# Patient Record
Sex: Male | Born: 1970 | Race: White | Hispanic: No | Marital: Married | Smoking: Current every day smoker
Health system: Southern US, Community
[De-identification: ages and names within clinical notes are randomized; demographics above are authoritative.]

## PROBLEM LIST (undated history)

## (undated) DIAGNOSIS — K219 Gastro-esophageal reflux disease without esophagitis: Secondary | ICD-10-CM

---
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
## 2016-12-07 ENCOUNTER — Emergency Department (HOSPITAL_BASED_OUTPATIENT_CLINIC_OR_DEPARTMENT_OTHER)
Admission: EM | Admit: 2016-12-07 | Discharge: 2016-12-07 | Disposition: A | Discharge status: Home / Self Care | Payer: BLUE CROSS/BLUE SHIELD | Attending: Emergency Medicine | Admitting: Emergency Medicine

## 2016-12-07 ENCOUNTER — Encounter (HOSPITAL_BASED_OUTPATIENT_CLINIC_OR_DEPARTMENT_OTHER): Payer: Self-pay | Admitting: *Deleted

## 2016-12-07 ENCOUNTER — Emergency Department (HOSPITAL_BASED_OUTPATIENT_CLINIC_OR_DEPARTMENT_OTHER): Payer: BLUE CROSS/BLUE SHIELD

## 2016-12-07 DIAGNOSIS — Z791 Long term (current) use of non-steroidal anti-inflammatories (NSAID): Secondary | ICD-10-CM | POA: Diagnosis not present

## 2016-12-07 DIAGNOSIS — F17210 Nicotine dependence, cigarettes, uncomplicated: Secondary | ICD-10-CM | POA: Insufficient documentation

## 2016-12-07 DIAGNOSIS — R1031 Right lower quadrant pain: Secondary | ICD-10-CM | POA: Diagnosis present

## 2016-12-07 DIAGNOSIS — N200 Calculus of kidney: Secondary | ICD-10-CM

## 2016-12-07 HISTORY — DX: Gastro-esophageal reflux disease without esophagitis: K21.9

## 2016-12-07 LAB — CBC WITH DIFFERENTIAL/PLATELET
Basophils Absolute: 0 10*3/uL (ref 0.0–0.1)
Basophils Relative: 0 %
EOS PCT: 0 %
Eosinophils Absolute: 0 10*3/uL (ref 0.0–0.7)
HCT: 45.4 % (ref 39.0–52.0)
Hemoglobin: 15.5 g/dL (ref 13.0–17.0)
LYMPHS ABS: 1.4 10*3/uL (ref 0.7–4.0)
LYMPHS PCT: 12 %
MCH: 30.3 pg (ref 26.0–34.0)
MCHC: 34.1 g/dL (ref 30.0–36.0)
MCV: 88.8 fL (ref 78.0–100.0)
Monocytes Absolute: 0.3 10*3/uL (ref 0.1–1.0)
Monocytes Relative: 3 %
Neutro Abs: 10.2 10*3/uL — ABNORMAL HIGH (ref 1.7–7.7)
Neutrophils Relative %: 85 %
PLATELETS: 312 10*3/uL (ref 150–400)
RBC: 5.11 MIL/uL (ref 4.22–5.81)
RDW: 13.5 % (ref 11.5–15.5)
WBC: 12 10*3/uL — AB (ref 4.0–10.5)

## 2016-12-07 LAB — URINALYSIS, ROUTINE W REFLEX MICROSCOPIC
Bilirubin Urine: NEGATIVE
GLUCOSE, UA: NEGATIVE mg/dL
HGB URINE DIPSTICK: NEGATIVE
Ketones, ur: NEGATIVE mg/dL
Leukocytes, UA: NEGATIVE
Nitrite: NEGATIVE
Protein, ur: NEGATIVE mg/dL
SPECIFIC GRAVITY, URINE: 1.02 (ref 1.005–1.030)
pH: 7 (ref 5.0–8.0)

## 2016-12-07 LAB — COMPREHENSIVE METABOLIC PANEL
ALT: 32 U/L (ref 17–63)
ANION GAP: 5 (ref 5–15)
AST: 32 U/L (ref 15–41)
Albumin: 4.6 g/dL (ref 3.5–5.0)
Alkaline Phosphatase: 84 U/L (ref 38–126)
BUN: 17 mg/dL (ref 6–20)
CO2: 28 mmol/L (ref 22–32)
CREATININE: 1.25 mg/dL — AB (ref 0.61–1.24)
Calcium: 9.6 mg/dL (ref 8.9–10.3)
Chloride: 105 mmol/L (ref 101–111)
Glucose, Bld: 127 mg/dL — ABNORMAL HIGH (ref 65–99)
Potassium: 4.4 mmol/L (ref 3.5–5.1)
Sodium: 138 mmol/L (ref 135–145)
Total Bilirubin: 0.6 mg/dL (ref 0.3–1.2)
Total Protein: 7.7 g/dL (ref 6.5–8.1)

## 2016-12-07 LAB — LIPASE, BLOOD: LIPASE: 31 U/L (ref 11–51)

## 2016-12-07 MED ORDER — ONDANSETRON HCL 4 MG PO TABS: 4.0000 mg | ORAL_TABLET | Freq: Four times a day (QID) | ORAL | 0 refills | Status: AC

## 2016-12-07 MED ORDER — KETOROLAC TROMETHAMINE 30 MG/ML IJ SOLN
30.0000 mg | Freq: Once | INTRAMUSCULAR | Status: AC
  Administered 2016-12-07: 30 mg via INTRAVENOUS
  Filled 2016-12-07: qty 1

## 2016-12-07 MED ORDER — TAMSULOSIN HCL 0.4 MG PO CAPS: 0.4000 mg | ORAL_CAPSULE | Freq: Every day | ORAL | 0 refills | Status: AC

## 2016-12-07 MED ORDER — HYDROCODONE-ACETAMINOPHEN 5-325 MG PO TABS: 2.0000 | ORAL_TABLET | ORAL | 0 refills | Status: AC | PRN

## 2016-12-07 MED ORDER — HYDROMORPHONE HCL 1 MG/ML IJ SOLN
1.0000 mg | Freq: Once | INTRAMUSCULAR | Status: DC
Start: 2016-12-07 — End: 2016-12-07

## 2016-12-07 MED ORDER — SODIUM CHLORIDE 0.9 % IV BOLUS (SEPSIS)
1000.0000 mL | Freq: Once | INTRAVENOUS | Status: AC
  Administered 2016-12-07: 1000 mL via INTRAVENOUS

## 2016-12-07 MED ORDER — ONDANSETRON HCL 4 MG/2ML IJ SOLN
4.0000 mg | Freq: Once | INTRAMUSCULAR | Status: AC
  Administered 2016-12-07: 4 mg via INTRAVENOUS
  Filled 2016-12-07: qty 2

## 2016-12-07 NOTE — Discharge Instructions (Signed)
Return if any problems.  The cardiologist will call and set appointment for you to wear a monitor and to have a heart rate evaluation/  Schedule to see the Urologist for evaluation

## 2016-12-07 NOTE — ED Triage Notes (Signed)
Pt reports periumbilical and R lower back pain since last night. Denies fever. Reports n/v this am. Was seen at Urgent Care this morning and sent here (U/A from Urgent Care negative).

## 2016-12-07 NOTE — ED Notes (Signed)
ED Provider at bedside. 

## 2016-12-07 NOTE — ED Notes (Signed)
Doctor will call back on [phone]

## 2016-12-07 NOTE — ED Notes (Signed)
Patient transported to CT 

## 2016-12-07 NOTE — ED Provider Notes (Signed)
[facility]   CSN: [id number] Arrival date & time: 12/07/16  [id number]     History   Chief Complaint Chief Complaint  Patient presents with  . Abdominal Pain    HPI Bradley Hardy is a 46 y.o. male.  The history is provided by the patient. No language interpreter was used.  Abdominal Pain   This is a new problem. The current episode started yesterday. The problem occurs constantly. The problem has been gradually worsening. The pain is located in the RLQ. The pain is moderate. Nothing aggravates the symptoms. Nothing relieves the symptoms. Past workup does not include CT scan.  Pt feels like he has a kidney stone.  Pt reports he has the same pain as previous stone  Past Medical History:  Diagnosis Date  . GERD (gastroesophageal reflux disease)     There are no active problems to display for this patient.   Past Surgical History:  Procedure Laterality Date  . NOSE SURGERY  1991       Home Medications    Prior to Admission medications   Medication Sig Start Date End Date Taking? Authorizing Provider  ibuprofen (ADVIL,MOTRIN) 200 MG tablet Take 400 mg by mouth daily.   Yes Historical Provider, MD  HYDROcodone-acetaminophen (NORCO/VICODIN) 5-325 MG tablet Take 2 tablets by mouth every 4 (four) hours as needed. 12/07/16   Elson AreasLeslie K Devynne Sturdivant, PA-C  ondansetron (ZOFRAN) 4 MG tablet Take 1 tablet (4 mg total) by mouth every 6 (six) hours. 12/07/16   Elson AreasLeslie K Sophiamarie Nease, PA-C  tamsulosin (FLOMAX) 0.4 MG CAPS capsule Take 1 capsule (0.4 mg total) by mouth daily. 12/07/16   Elson AreasLeslie K Francis Doenges, PA-C    Family History No family history on file.  Social History Social History  Substance Use Topics  . Smoking status: Current Every Day Smoker    Types: E-cigarettes  . Smokeless tobacco: Never Used  . Alcohol use Yes     Comment: occ     Allergies   Patient has no known allergies.   Review of Systems Review of Systems  Gastrointestinal: Positive for abdominal  pain.  All other systems reviewed and are negative.    Physical Exam Updated Vital Signs BP 113/89   Pulse (!) 48   Temp 99 F (37.2 C) (Oral)   Resp 20   Ht 6\' 1"  (1.854 m)   Wt (!) 154.2 kg   SpO2 98%   BMI 44.86 kg/m   Physical Exam  Constitutional: He appears well-developed and well-nourished.  HENT:  Head: Normocephalic and atraumatic.  Right Ear: External ear normal.  Left Ear: External ear normal.  Eyes: Conjunctivae are normal.  Neck: Neck supple.  Cardiovascular: Regular rhythm.   No murmur heard. bradycardia  Pulmonary/Chest: Effort normal and breath sounds normal. No respiratory distress.  Abdominal: Soft. There is no tenderness.  Musculoskeletal: He exhibits no edema.  Neurological: He is alert.  Skin: Skin is warm and dry.  Psychiatric: He has a normal mood and affect.  Nursing note and vitals reviewed.    ED Treatments / Results  Labs (all labs ordered are listed, but only abnormal results are displayed) Labs Reviewed  CBC WITH DIFFERENTIAL/PLATELET - Abnormal; Notable for the following:       Result Value   WBC 12.0 (*)    Neutro Abs 10.2 (*)    All other components within normal limits  COMPREHENSIVE METABOLIC PANEL - Abnormal; Notable for the following:    Glucose, Bld 127 (*)  Creatinine, Ser 1.25 (*)    All other components within normal limits  URINALYSIS, ROUTINE W REFLEX MICROSCOPIC  LIPASE, BLOOD    EKG  EKG Interpretation  Date/Time:  Saturday December 07 2016 11:03:36 EST Ventricular Rate:  47 PR Interval:    QRS Duration: 132 QT Interval:  439 QTC Calculation: 389 R Axis:   28 Text Interpretation:  Sinus bradycardia IVCD, consider atypical RBBB ST elev, probable normal early repol pattern No old tracing to compare Confirmed by GOLDSTON MD, SCOTT [phone]) on 12/07/2016 11:06:59 AM       Radiology Ct Renal Stone Study  Result Date: 12/07/2016 CLINICAL DATA:  Right-sided flank pain EXAM: CT ABDOMEN AND PELVIS WITHOUT  CONTRAST TECHNIQUE: Multidetector CT imaging of the abdomen and pelvis was performed following the standard protocol without IV contrast. COMPARISON:  None. FINDINGS: Lower chest: No acute abnormality. Hepatobiliary: No focal liver abnormality is seen. No gallstones, gallbladder wall thickening, or biliary dilatation. Pancreas: Unremarkable. No pancreatic ductal dilatation or surrounding inflammatory changes. Spleen: Normal in size without focal abnormality. Adrenals/Urinary Tract: The adrenal glands demonstrate evidence of a 2.4 cm hypodense lesion within the left adrenal consistent with adenoma. The left kidney demonstrates some nonobstructive changes the left ureter is within normal limits. The bladder is decompressed. Right kidney demonstrates increased perinephric stranding as well as mild hydronephrosis. A 4 mm distal right ureteral stone is noted best seen on image number 74 of series 5. Stomach/Bowel: Scattered diverticular changes noted without evidence of diverticulitis. The appendix is within normal limits. No obstructive changes are seen. Vascular/Lymphatic: Aortic atherosclerosis. No enlarged abdominal or pelvic lymph nodes. Reproductive: Uterus and bilateral adnexa are unremarkable. Other: No abdominal wall hernia or abnormality. No abdominopelvic ascites. Musculoskeletal: No acute or significant osseous findings. IMPRESSION: Distal right ureteral stone with obstructive change. Nonobstructing left renal stones. Likely Left adrenal adenoma. Electronically Signed   By: Alcide Clever M.D.   On: 12/07/2016 10:47    Procedures Procedures (including critical care time)  Medications Ordered in ED Medications  ondansetron (ZOFRAN) injection 4 mg (4 mg Intravenous Given 12/07/16 1131)  ketorolac (TORADOL) 30 MG/ML injection 30 mg (30 mg Intravenous Given 12/07/16 1133)  sodium chloride 0.9 % bolus 1,000 mL (0 mLs Intravenous Stopped 12/07/16 1305)     Initial Impression / Assessment and Plan / ED  Course  I have reviewed the triage vital signs and the nursing notes.  Pertinent labs & imaging results that were available during my care of the patient were reviewed by me and considered in my medical decision making (see chart for details).     Ct shows stone.   Pt is bradycardic to 39.  Pt observed extended period of time.  No narcotics.   I spoke with Dr. Graciela Husbands who will schedule pt for a holter monitor and office follow up.  Pt is advised to see Urologist for complete evaluation.   Final Clinical Impressions(s) / ED Diagnoses   Final diagnoses:  Right kidney stone    New Prescriptions Discharge Medication List as of 12/07/2016 12:43 PM     Meds ordered this encounter  Medications  . ibuprofen (ADVIL,MOTRIN) 200 MG tablet    Sig: Take 400 mg by mouth daily.  Marland Kitchen DISCONTD: HYDROmorphone (DILAUDID) injection 1 mg  . ondansetron (ZOFRAN) injection 4 mg  . ketorolac (TORADOL) 30 MG/ML injection 30 mg  . sodium chloride 0.9 % bolus 1,000 mL  . tamsulosin (FLOMAX) 0.4 MG CAPS capsule    Sig: Take 1 capsule (  0.4 mg total) by mouth daily.    Dispense:  15 capsule    Refill:  0    Order Specific Question:   Supervising Provider    Answer:   MILLER, Rayland [[id number]]  . HYDROcodone-acetaminophen (NORCO/VICODIN) 5-325 MG tablet    Sig: Take 2 tablets by mouth every 4 (four) hours as needed.    Dispense:  20 tablet    Refill:  0    Order Specific Question:   Supervising Provider    Answer:   MILLER, Jackob [[id number]]  . ondansetron (ZOFRAN) 4 MG tablet    Sig: Take 1 tablet (4 mg total) by mouth every 6 (six) hours.    Dispense:  12 tablet    Refill:  0    Order Specific Question:   Supervising Provider    Answer:   Eber Hong [[id number]]   An After Visit Summary was printed and given to the patient.    Lonia Skinner Gore, PA-C 12/07/16 1633    Pricilla Loveless, MD 12/08/16 [phone]

## 2016-12-09 ENCOUNTER — Telehealth: Payer: Self-pay

## 2016-12-09 NOTE — Telephone Encounter (Signed)
Lmov for patient to call back and schedule a ED Fu  Was seen on 12/07/16  Will try again at a later time.

## 2016-12-10 ENCOUNTER — Other Ambulatory Visit: Payer: Self-pay | Admitting: *Deleted

## 2016-12-10 DIAGNOSIS — R001 Bradycardia, unspecified: Secondary | ICD-10-CM

## 2016-12-10 NOTE — Telephone Encounter (Signed)
[place] see Dr Graciela HusbandsKlein on 12/27/16  Nothing further needed

## 2016-12-27 ENCOUNTER — Institutional Professional Consult (permissible substitution): Payer: BLUE CROSS/BLUE SHIELD | Admitting: Internal Medicine

## 2017-01-01 ENCOUNTER — Ambulatory Visit (INDEPENDENT_AMBULATORY_CARE_PROVIDER_SITE_OTHER): Payer: BLUE CROSS/BLUE SHIELD

## 2017-01-01 DIAGNOSIS — R001 Bradycardia, unspecified: Secondary | ICD-10-CM

## 2017-01-07 ENCOUNTER — Encounter (INDEPENDENT_AMBULATORY_CARE_PROVIDER_SITE_OTHER): Payer: Self-pay

## 2017-01-07 ENCOUNTER — Ambulatory Visit (INDEPENDENT_AMBULATORY_CARE_PROVIDER_SITE_OTHER): Payer: BLUE CROSS/BLUE SHIELD | Admitting: Internal Medicine

## 2017-01-07 VITALS — BP 123/77 | HR 76 | Ht 73.0 in | Wt 338.0 lb

## 2017-01-07 DIAGNOSIS — R001 Bradycardia, unspecified: Secondary | ICD-10-CM

## 2017-01-07 NOTE — Progress Notes (Signed)
ELECTROPHYSIOLOGY CONSULT NOTE  Patient ID: Bradley Hardy, MRN: [id number], DOB/AGE: [birth year] 46 y.o. Admit date: (Not on file) Date of Consult: 01/07/2017  Primary Physician: No PCP Per Patient Primary Cardiologist:  new   Bradley Hardy is being seen today for the evaluation of bradycardia at the request of ER MD     HPI Bradley Hardy is a 46 y.o. male presented last month with a kidney stone for severe pain. He was noted to be bradycardic into the 30s and hypotensive. He was treated with volume with gradual improvement.  He underwent Holter monitoring which demonstrated near-normal Heart rate excursion.  He has a history of atypical chest pain. He has been diagnosed with reflux. These episodes are unassociated with exertion. More recently he has had some chest discomfort which is aggravated by turning of his chest. He does have a family history of coronary disease.  He has been working on losing weight. He is down to 350 pounds. He says it has been years and years since he has been in this light.  He has sleep disordered breathing.      Past Medical History:  Diagnosis Date  . GERD (gastroesophageal reflux disease)       Surgical History:  Past Surgical History:  Procedure Laterality Date  . NOSE SURGERY  1991     Home Meds: Prior to Admission medications   Medication Sig Start Date End Date Taking? Authorizing Provider  HYDROcodone-acetaminophen (NORCO/VICODIN) 5-325 MG tablet Take 2 tablets by mouth every 4 (four) hours as needed. 12/07/16   Elson Areas, PA-C  ibuprofen (ADVIL,MOTRIN) 200 MG tablet Take 400 mg by mouth daily.    Historical Provider, MD  ondansetron (ZOFRAN) 4 MG tablet Take 1 tablet (4 mg total) by mouth every 6 (six) hours. 12/07/16   Elson Areas, PA-C  tamsulosin (FLOMAX) 0.4 MG CAPS capsule Take 1 capsule (0.4 mg total) by mouth daily. 12/07/16   Elson Areas, PA-C    Allergies: No Known Allergies  Social History   Social  History  . Marital status: Married    Spouse name: N/A  . Number of children: N/A  . Years of education: N/A   Occupational History  . Not on file.   Social History Main Topics  . Smoking status: Current Every Day Smoker    Types: E-cigarettes  . Smokeless tobacco: Never Used  . Alcohol use Yes     Comment: occ  . Drug use: No  . Sexual activity: Not on file   Other Topics Concern  . Not on file   Social History Narrative  . No narrative on file     No family history on file.   ROS:  Please see the history of present illness.     All other systems reviewed and negative.    Physical Exam: Blood pressure 123/77, pulse 76, height  (1.854 m), weight (!) 338 lb (153.3 kg), SpO2 99 %. General: Well developed, .Morbidly obese  male in no acute distress. Head: Normocephalic, atraumatic, sclera non-icteric, no xanthomas, nares are without discharge. EENT: normal  Lymph Nodes:  none Neck: Negative for carotid bruits. JVD not elevated. Back:without scoliosis kyphosi Chest wall pain with manipulation Lungs: Clear bilaterally to auscultation without wheezes, rales, or rhonchi. Breathing is unlabored. Heart: RRR with S1 S2. No murmur . No rubs, or gallops appreciated. Abdomen: Soft, non-tender, non-distended with normoactive bowel sounds. No hepatomegaly. No rebound/guarding. No obvious abdominal masses. Msk:  Strength  and tone appear normal for age. Extremities: No clubbing or cyanosis. No edema.  Distal pedal pulses are 2+ and equal bilaterally. Skin: Warm and Dry Neuro: Alert and oriented X 3. CN III-XII intact Grossly normal sensory and motor function . Psych:  Responds to questions appropriately with a normal affect.      Labs: Cardiac Enzymes No results for input(s): CKTOTAL, CKMB, TROPONINI in the last 72 hours. CBC Lab Results  Component Value Date   WBC 12.0 (H) 12/07/2016   HGB 15.5 12/07/2016   HCT 45.4 12/07/2016   MCV 88.8 12/07/2016   PLT 312  12/07/2016   EKG: Sinus at 70 Intervals 18/12/39 Axis left -11  Holter was reviewed as noted above   Assessment and Plan:  Sinus bradycardia and associated hypotension  GU reflux disease  Nephrolithiasis  Morbid obesity   The patient has sinus bradycardia in the emergency room. Holter monitoring demonstrated no significant bradycardia. He has had no attributable symptoms. I suspect it was related to hyper vagotonia in the context of his pain.  His chest pain syndrome does not seem to be cardiac.  With his family history, there is always some concern. I worry about false positive testing given his size. He is advised to let us know if he has developed exertional chest discomfort. He is encouraged to continue to lose weight.    Sherryl Manges

## 2017-01-07 NOTE — Patient Instructions (Signed)
Medication Instructions: - Your physician recommends that you continue on your current medications as directed. Please refer to the Current Medication list given to you today.  Labwork: - none ordered  Procedures/Testing: - none ordered  Follow-Up: - Dr. Klein will see you back on an as needed basis.  Any Additional Special Instructions Will Be Listed Below (If Applicable).     If you need a refill on your cardiac medications before your next appointment, please call your pharmacy.   

## 2018-05-18 IMAGING — CT CT RENAL STONE PROTOCOL
2 of 4 series · 17 of 46 positions shown, 19 images · non-contrast
Comparison: None.

CLINICAL DATA: Right-sided flank pain

EXAM:
CT ABDOMEN AND PELVIS WITHOUT CONTRAST
TECHNIQUE: Multidetector CT imaging of the abdomen and pelvis was performed
following the standard protocol without IV contrast.

[Series 2: axial st · axial · 0.98mm/px · z∈[-561,-76]mm · 14 of 107 slices shown, 16 images]
[im 5/107  soft-tissue]
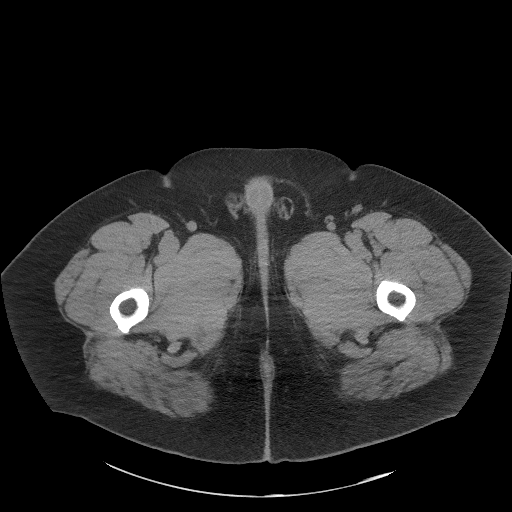
[im 5/107  bone]
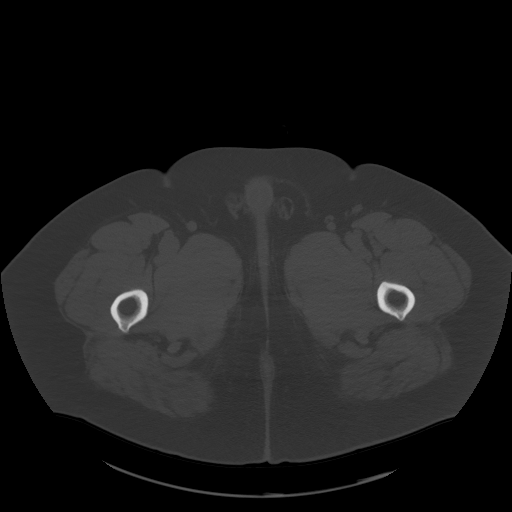
[im 14/107  soft-tissue]
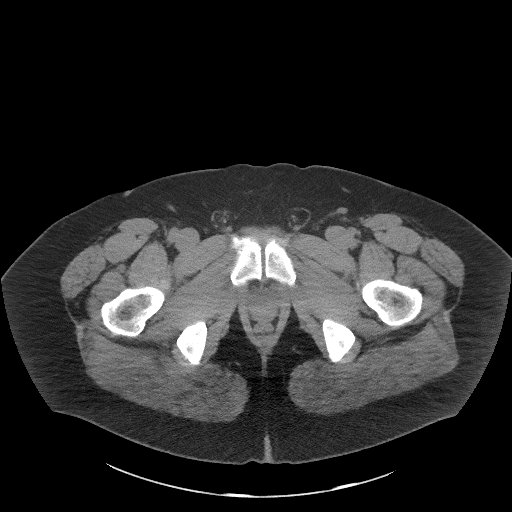
[im 23/107  soft-tissue]
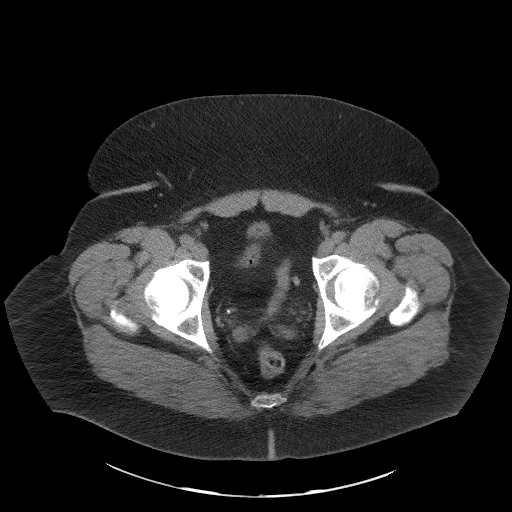
[im 27/107  soft-tissue]
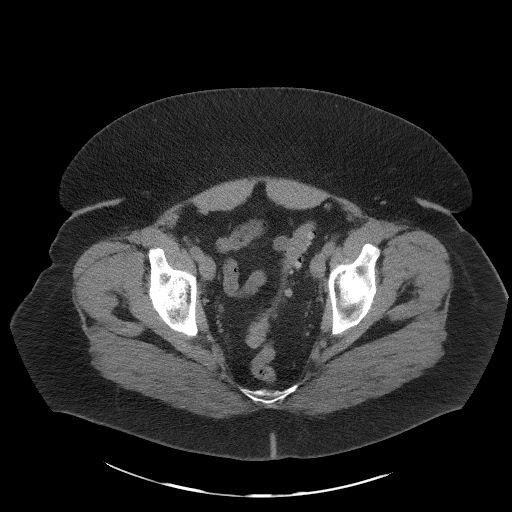
[im 36/107  soft-tissue]
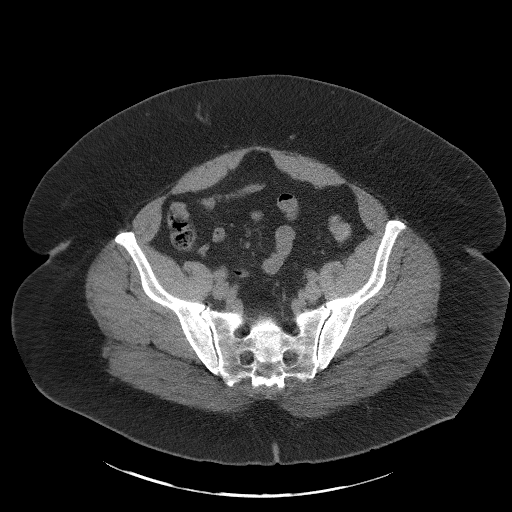
[im 45/107  soft-tissue]
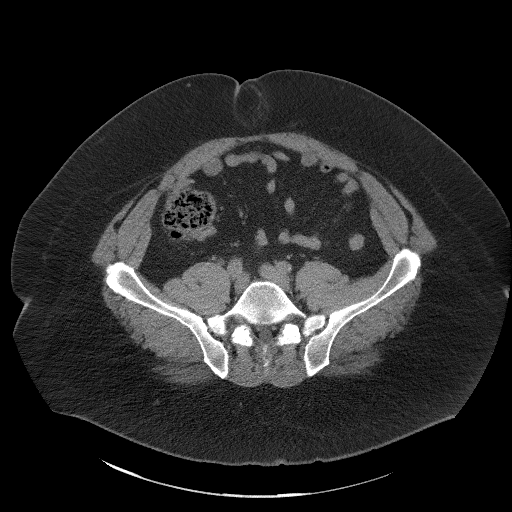
[im 49/107  soft-tissue]
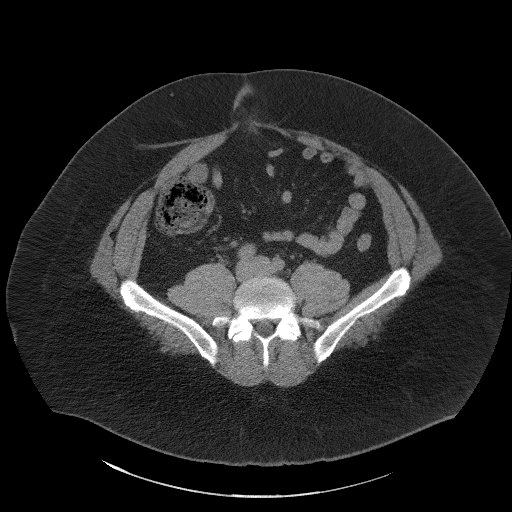
[im 58/107  soft-tissue]
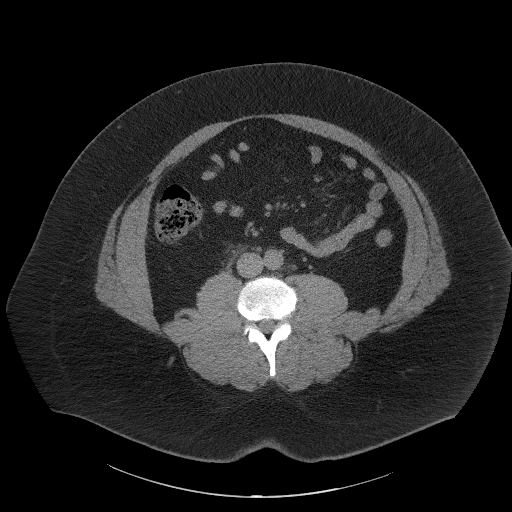
[im 62/107  soft-tissue]
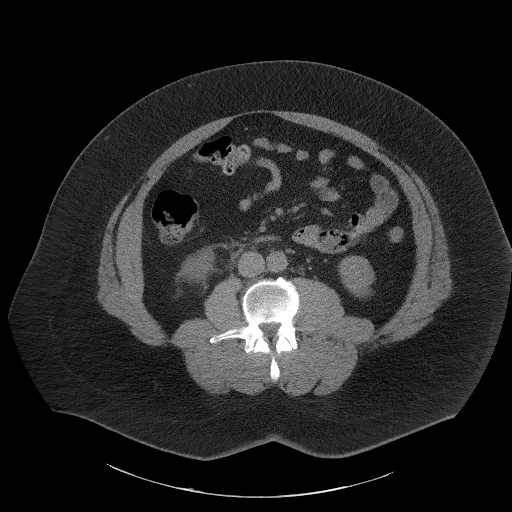
[im 62/107  bone]
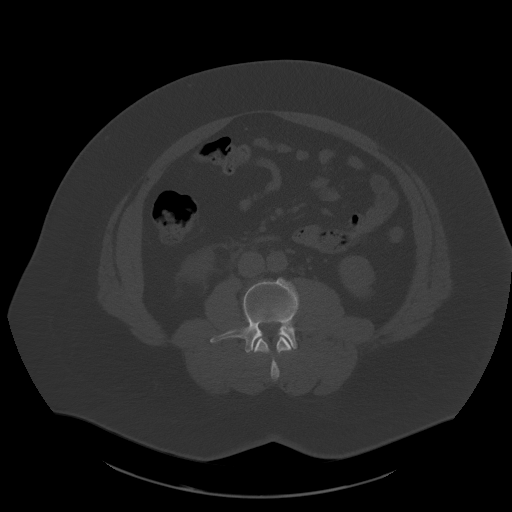
[im 71/107  soft-tissue]
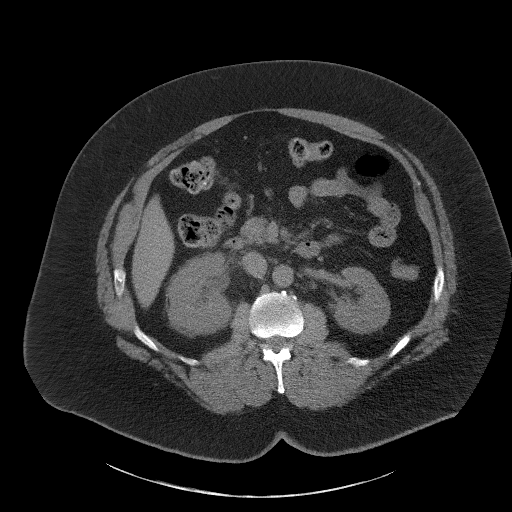
[im 80/107  soft-tissue]
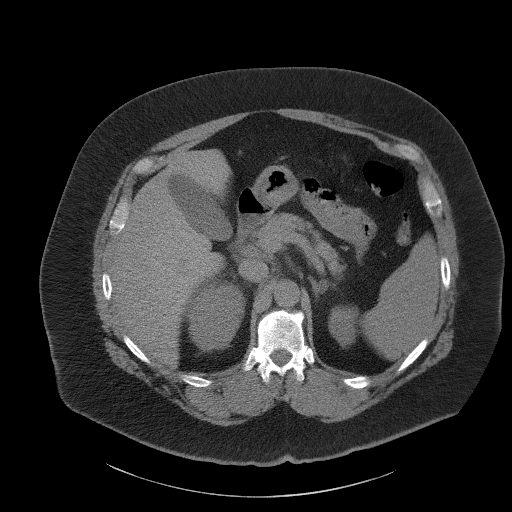
[im 84/107  soft-tissue]
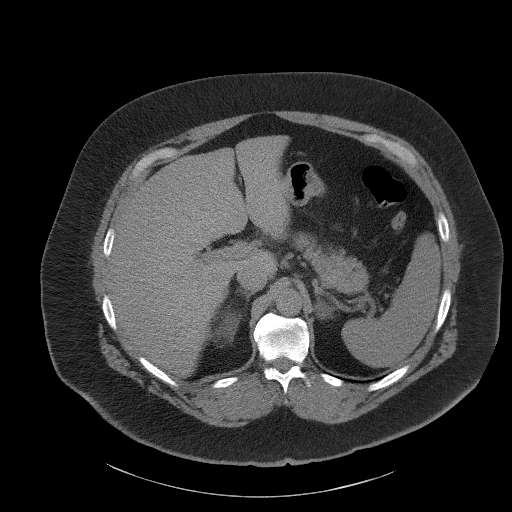
[im 93/107  soft-tissue]
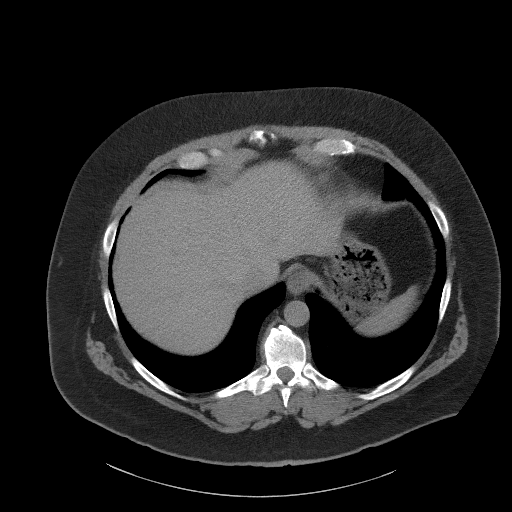
[im 102/107  soft-tissue]
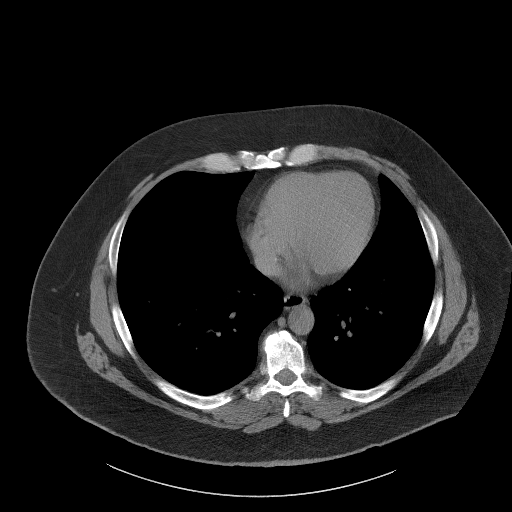

[Series 5: coronal st · coronal · 1.12mm/px · 3 of 109 slices shown]
[im 37/109  soft-tissue]
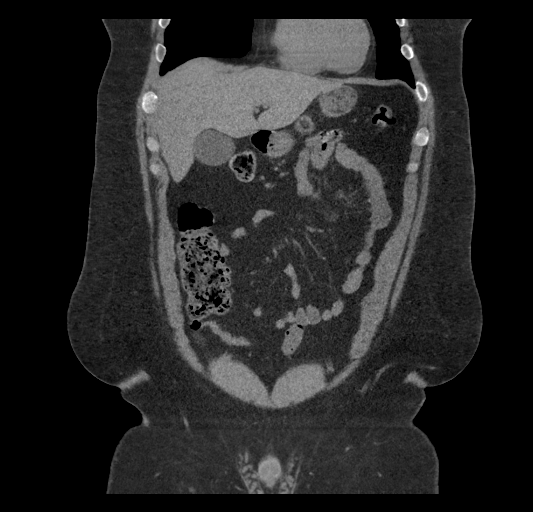
[im 49/109  soft-tissue]
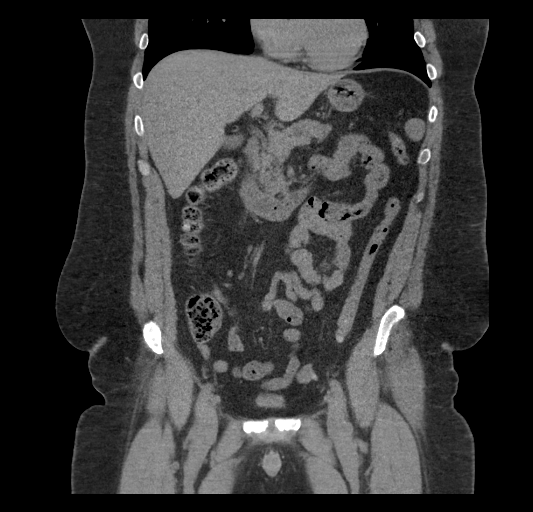
[im 61/109  soft-tissue]
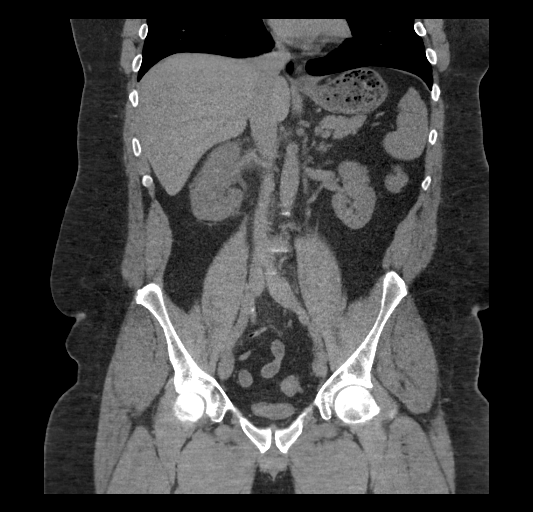

[17 of 46 positions shown; findings below may reference images not displayed]

FINDINGS: Lower chest: No acute abnormality.

Hepatobiliary: No focal liver abnormality is seen. No gallstones,
gallbladder wall thickening, or biliary dilatation.

Pancreas: Unremarkable. No pancreatic ductal dilatation or
surrounding inflammatory changes.

Spleen: Normal in size without focal abnormality.

Adrenals/Urinary Tract: The adrenal glands demonstrate evidence of a
2.4 cm hypodense lesion within the left adrenal consistent with
adenoma. The left kidney demonstrates some nonobstructive changes
the left ureter is within normal limits. The bladder is
decompressed. Right kidney demonstrates increased perinephric
stranding as well as mild hydronephrosis. A 4 mm distal right
ureteral stone is noted best seen on image number 74 of series 5.

Stomach/Bowel: Scattered diverticular changes noted without evidence
of diverticulitis. The appendix is within normal limits. No
obstructive changes are seen.

Vascular/Lymphatic: Aortic atherosclerosis. No enlarged abdominal or
pelvic lymph nodes.

Reproductive: Uterus and bilateral adnexa are unremarkable.

Other: No abdominal wall hernia or abnormality. No abdominopelvic
ascites.

Musculoskeletal: No acute or significant osseous findings.
IMPRESSION: Distal right ureteral stone with obstructive change.

Nonobstructing left renal stones.

Likely Left adrenal adenoma.
